# Patient Record
Sex: Female | Born: 1980 | Race: White | Hispanic: No | Marital: Single
Health system: Southern US, Community
[De-identification: ages and names within clinical notes are randomized; demographics above are authoritative.]

## PROBLEM LIST (undated history)

## (undated) DIAGNOSIS — K219 Gastro-esophageal reflux disease without esophagitis: Secondary | ICD-10-CM

## (undated) DIAGNOSIS — N200 Calculus of kidney: Secondary | ICD-10-CM

---

## 2014-12-22 HISTORY — PX: BACK SURGERY: SHX140

## 2016-11-13 ENCOUNTER — Emergency Department (HOSPITAL_BASED_OUTPATIENT_CLINIC_OR_DEPARTMENT_OTHER): Payer: Medicaid - Out of State

## 2016-11-13 ENCOUNTER — Emergency Department (HOSPITAL_BASED_OUTPATIENT_CLINIC_OR_DEPARTMENT_OTHER)
Admission: EM | Admit: 2016-11-13 | Discharge: 2016-11-14 | Disposition: A | Discharge status: Home / Self Care | Payer: Medicaid - Out of State | Attending: Emergency Medicine | Admitting: Emergency Medicine

## 2016-11-13 ENCOUNTER — Encounter (HOSPITAL_BASED_OUTPATIENT_CLINIC_OR_DEPARTMENT_OTHER): Payer: Self-pay

## 2016-11-13 DIAGNOSIS — R109 Unspecified abdominal pain: Secondary | ICD-10-CM

## 2016-11-13 DIAGNOSIS — Z79899 Other long term (current) drug therapy: Secondary | ICD-10-CM | POA: Diagnosis not present

## 2016-11-13 DIAGNOSIS — R1011 Right upper quadrant pain: Secondary | ICD-10-CM

## 2016-11-13 HISTORY — DX: Calculus of kidney: N20.0

## 2016-11-13 HISTORY — DX: Gastro-esophageal reflux disease without esophagitis: K21.9

## 2016-11-13 LAB — COMPREHENSIVE METABOLIC PANEL
ALBUMIN: 3.8 g/dL (ref 3.5–5.0)
ALT: 19 U/L (ref 14–54)
AST: 25 U/L (ref 15–41)
Alkaline Phosphatase: 123 U/L (ref 38–126)
Anion gap: 9 (ref 5–15)
BUN: 11 mg/dL (ref 6–20)
CHLORIDE: 104 mmol/L (ref 101–111)
CO2: 24 mmol/L (ref 22–32)
CREATININE: 0.69 mg/dL (ref 0.44–1.00)
Calcium: 8.7 mg/dL — ABNORMAL LOW (ref 8.9–10.3)
GFR calc Af Amer: >60 mL/min (ref >60)
GFR calc non Af Amer: >60 mL/min (ref >60)
GLUCOSE: 99 mg/dL (ref 65–99)
POTASSIUM: 3.9 mmol/L (ref 3.5–5.1)
SODIUM: 137 mmol/L (ref 135–145)
Total Bilirubin: 0.3 mg/dL (ref 0.3–1.2)
Total Protein: 7.2 g/dL (ref 6.5–8.1)

## 2016-11-13 LAB — CBC WITH DIFFERENTIAL/PLATELET
BASOS ABS: 0.1 10*3/uL (ref 0.0–0.1)
BASOS PCT: 0 %
EOS PCT: 2 %
Eosinophils Absolute: 0.3 10*3/uL (ref 0.0–0.7)
HCT: 37.5 % (ref 36.0–46.0)
Hemoglobin: 12.6 g/dL (ref 12.0–15.0)
LYMPHS PCT: 21 %
Lymphs Abs: 3.9 10*3/uL (ref 0.7–4.0)
MCH: 33.2 pg (ref 26.0–34.0)
MCHC: 33.6 g/dL (ref 30.0–36.0)
MCV: 98.9 fL (ref 78.0–100.0)
MONO ABS: 1.7 10*3/uL — AB (ref 0.1–1.0)
Monocytes Relative: 9 %
Neutro Abs: 12.4 10*3/uL — ABNORMAL HIGH (ref 1.7–7.7)
Neutrophils Relative %: 68 %
PLATELETS: 320 10*3/uL (ref 150–400)
RBC: 3.79 MIL/uL — AB (ref 3.87–5.11)
RDW: 14.1 % (ref 11.5–15.5)
WBC: 18.3 10*3/uL — AB (ref 4.0–10.5)

## 2016-11-13 LAB — LIPASE, BLOOD: LIPASE: 17 U/L (ref 11–51)

## 2016-11-13 LAB — URINALYSIS, ROUTINE W REFLEX MICROSCOPIC
BILIRUBIN URINE: NEGATIVE
Glucose, UA: NEGATIVE mg/dL
HGB URINE DIPSTICK: NEGATIVE
Ketones, ur: NEGATIVE mg/dL
Leukocytes, UA: NEGATIVE
NITRITE: NEGATIVE
PROTEIN: NEGATIVE mg/dL
SPECIFIC GRAVITY, URINE: 1.011 (ref 1.005–1.030)
pH: 5.5 (ref 5.0–8.0)

## 2016-11-13 LAB — PREGNANCY, URINE: PREG TEST UR: NEGATIVE

## 2016-11-13 MED ORDER — ONDANSETRON HCL 4 MG/2ML IJ SOLN
4.0000 mg | Freq: Once | INTRAMUSCULAR | Status: AC
  Administered 2016-11-13: 4 mg via INTRAVENOUS
  Filled 2016-11-13: qty 2

## 2016-11-13 MED ORDER — IOPAMIDOL (ISOVUE-300) INJECTION 61%
100.0000 mL | Freq: Once | INTRAVENOUS | Status: AC | PRN
  Administered 2016-11-14: 100 mL via INTRAVENOUS

## 2016-11-13 MED ORDER — HYDROMORPHONE HCL 1 MG/ML IJ SOLN
1.0000 mg | Freq: Once | INTRAMUSCULAR | Status: AC
  Administered 2016-11-13: 1 mg via INTRAVENOUS
  Filled 2016-11-13: qty 1

## 2016-11-13 MED ORDER — SODIUM CHLORIDE 0.9 % IV BOLUS (SEPSIS)
1000.0000 mL | Freq: Once | INTRAVENOUS | Status: AC
  Administered 2016-11-13: 1000 mL via INTRAVENOUS

## 2016-11-13 NOTE — ED Triage Notes (Signed)
Pt c/o sudden onset right flank pain that radiates to her lower abdomen with difficulty urinating and n/v

## 2016-11-13 NOTE — ED Notes (Signed)
Patient ambulated to the restroom with stand by assist from significant other. Patient limping and bending over to walk.

## 2016-11-13 NOTE — ED Provider Notes (Signed)
[facility] Note   CSN: [id number] Arrival date & time: 11/13/16  2134  By signing my name below, I, Sonum Patel, attest that this documentation has been prepared under the direction and in the presence of RaytheonJosh Georgann Bramble PA-C. Electronically Signed: Sonum Patel, Neurosurgeoncribe. 11/13/16. 10:47 PM.  History   Chief Complaint Chief Complaint  Patient presents with  . Flank Pain    The history is provided by the patient and a significant other. No language interpreter was used.     HPI Comments: Victoria Sherman is a 35 y.o. female with past medical history of kidney stones who presents to the Emergency Department complaining acute onset right flank pain with radiation to the right lower abdomen and right shoulder starting about 8PM. She had an episode of pain, similar but less severe in [place] about 9 days ago. She had a mild episode in the interim. She states movement and deep breathing worsens her pain. She drank a couple of glasses of wine but denies daily alcohol use. She denies vomiting, fever, vaginal bleeding, vaginal discharge. She reports a back surgery which required incisions to the abdomen. No other surgeries and she still has her gallbladder and appendix. ? h/o kidney stone, states she passed something one time.   Past Medical History:  Diagnosis Date  . GERD (gastroesophageal reflux disease)   . Kidney stones     There are no active problems to display for this patient.   Past Surgical History:  Procedure Laterality Date  . BACK SURGERY  2016   spinal fusion    OB History    No data available       Home Medications    Prior to Admission medications   Medication Sig Start Date End Date Taking? Authorizing Provider  omeprazole (PRILOSEC) 20 MG capsule Take 20 mg by mouth daily.   Yes Historical Provider, MD  pregabalin (LYRICA) 200 MG capsule Take 200 mg by mouth 3 (three) times daily.   Yes Historical Provider, MD    Family History No family  history on file.  Social History Social History  Substance Use Topics  . Smoking status: Not on file  . Smokeless tobacco: Not on file  . Alcohol use Not on file     Allergies   Patient has no known allergies.   Review of Systems Review of Systems  Constitutional: Negative for fever.  HENT: Negative.  Negative for rhinorrhea and sore throat.   Eyes: Negative for redness.  Respiratory: Negative.  Negative for cough.   Cardiovascular: Negative.  Negative for chest pain.  Gastrointestinal: Positive for abdominal pain. Negative for diarrhea, nausea and vomiting.  Endocrine: Negative.   Genitourinary: Positive for flank pain. Negative for dysuria, hematuria, vaginal bleeding and vaginal discharge.  Musculoskeletal: Negative for myalgias.  Skin: Negative.  Negative for rash.  Neurological: Negative.  Negative for headaches.     Physical Exam Updated Vital Signs BP (!) 153/106 (BP Location: Right Arm)   Pulse 99   Temp 98.6 F (37 C) (Oral)   Resp 18   Ht 5\' 5"  (1.651 m)   Wt 155 lb (70.3 kg)   LMP 10/28/2016   SpO2 100%   BMI 25.79 kg/m   Physical Exam  Constitutional: She is oriented to person, place, and time. She appears well-developed and well-nourished. She appears distressed (appears uncomfortable).  HENT:  Head: Normocephalic and atraumatic.  Eyes: Conjunctivae are normal. Right eye exhibits no discharge. Left eye exhibits no discharge.  Neck:  Normal range of motion. Neck supple.  Cardiovascular: Normal rate, regular rhythm and normal heart sounds.   Pulmonary/Chest: Effort normal and breath sounds normal. No respiratory distress. She has no wheezes. She has no rales.  Abdominal: Soft. She exhibits no distension and no mass. There is tenderness (R abdomen). There is no guarding.  Neurological: She is alert and oriented to person, place, and time.  Skin: Skin is warm and dry.  Psychiatric: She has a normal mood and affect.  Nursing note and vitals  reviewed.    ED Treatments / Results  DIAGNOSTIC STUDIES: Oxygen Saturation is 100% on RA, normal by my interpretation.    COORDINATION OF CARE: 10:47 PM Will order labs and CT. Discussed treatment plan with pt at bedside and pt agreed to plan.    Labs (all labs ordered are listed, but only abnormal results are displayed) Labs Reviewed  CBC WITH DIFFERENTIAL/PLATELET - Abnormal; Notable for the following:       Result Value   WBC 18.3 (*)    RBC 3.79 (*)    Neutro Abs 12.4 (*)    Monocytes Absolute 1.7 (*)    All other components within normal limits  COMPREHENSIVE METABOLIC PANEL - Abnormal; Notable for the following:    Calcium 8.7 (*)    All other components within normal limits  URINALYSIS, ROUTINE W REFLEX MICROSCOPIC (NOT AT [facility])  PREGNANCY, URINE  LIPASE, BLOOD   Radiology No results found.  Procedures Procedures (including critical care time)  Medications Ordered in ED Medications  HYDROmorphone (DILAUDID) injection 1 mg (1 mg Intravenous Given 11/13/16 2313)  ondansetron (ZOFRAN) injection 4 mg (4 mg Intravenous Given 11/13/16 2313)  sodium chloride 0.9 % bolus 1,000 mL (1,000 mLs Intravenous New Bag/Given 11/13/16 2259)  iopamidol (ISOVUE-300) 61 % injection 100 mL (100 mLs Intravenous Contrast Given 11/14/16 0037)     Initial Impression / Assessment and Plan / ED Course  I have reviewed the triage vital signs and the nursing notes.  Pertinent labs & imaging results that were available during my care of the patient were reviewed by me and considered in my medical decision making (see chart for details).  Clinical Course    CT imaging reviewed by myself. Awaiting read.   Vital signs reviewed and are as follows: BP 114/71 (BP Location: Right Arm)   Pulse 93   Temp 98.6 F (37 C) (Oral)   Resp 20   Ht 5\' 5"  (1.651 m)   Wt 70.3 kg   LMP 10/28/2016   SpO2 98%   BMI 25.79 kg/m   Handoff to Dr. Bebe ShaggyWickline at shift change.    Final Clinical  Impressions(s) / ED Diagnoses   Final diagnoses:  Right flank pain   Patient with R flank pain. Leukocytosis -- pending completion of work-up.   New Prescriptions New Prescriptions   No medications on file   I personally performed the services described in this documentation, which was scribed in my presence. The recorded information has been reviewed and is accurate.    Renne CriglerJoshua Rex Magee, PA-C 11/14/16 0111    Zadie Rhineonald Wickline, MD 11/14/16 [phone]

## 2016-11-14 ENCOUNTER — Emergency Department (HOSPITAL_BASED_OUTPATIENT_CLINIC_OR_DEPARTMENT_OTHER): Payer: Medicaid - Out of State

## 2016-11-14 ENCOUNTER — Ambulatory Visit (HOSPITAL_BASED_OUTPATIENT_CLINIC_OR_DEPARTMENT_OTHER)
Admit: 2016-11-14 | Discharge: 2016-11-14 | Disposition: A | Discharge status: Home / Self Care | Payer: Medicaid - Out of State | Attending: Emergency Medicine | Admitting: Emergency Medicine

## 2016-11-14 ENCOUNTER — Encounter (HOSPITAL_BASED_OUTPATIENT_CLINIC_OR_DEPARTMENT_OTHER): Payer: Self-pay

## 2016-11-14 DIAGNOSIS — R1011 Right upper quadrant pain: Secondary | ICD-10-CM

## 2016-11-14 MED ORDER — HYDROMORPHONE HCL 1 MG/ML IJ SOLN
1.0000 mg | Freq: Once | INTRAMUSCULAR | Status: AC
  Administered 2016-11-14: 1 mg via INTRAVENOUS
  Filled 2016-11-14: qty 1

## 2016-11-14 MED ORDER — KETOROLAC TROMETHAMINE 15 MG/ML IJ SOLN
15.0000 mg | Freq: Once | INTRAMUSCULAR | Status: AC
  Administered 2016-11-14: 15 mg via INTRAVENOUS
  Filled 2016-11-14: qty 1

## 2016-11-14 NOTE — ED Notes (Signed)
Patient transported to CT 

## 2016-11-14 NOTE — Discharge Instructions (Signed)
You will need to come back later today to have an ultrasound of your gallbladder  SEEK IMMEDIATE MEDICAL ATTENTION IF: The pain does not go away or becomes severe, particularly over the next 8-12 hours.  A temperature above 100.53F develops.  Repeated vomiting occurs (multiple episodes).  The pain becomes localized to portions of the abdomen. The right side could possibly be appendicitis. In an adult, the left lower portion of the abdomen could be colitis or diverticulitis.  Blood is being passed in stools or vomit (bright red or black tarry stools).  Return also if you develop chest pain, difficulty breathing, dizziness or fainting, or become confused, poorly responsive, or inconsolable.

## 2017-06-24 IMAGING — CT CT ABD-PELV W/ CM
2 of 4 series · 16 of 46 positions shown, 18 images · IV contrast (APPLIED)
Comparison: None.

CLINICAL DATA: Severe right abdominal pain for 4 hours. Similar
episode on 11/04/2016. White cell count 18.3.

EXAM:
CT ABDOMEN AND PELVIS WITH CONTRAST
TECHNIQUE: Multidetector CT imaging of the abdomen and pelvis was performed
using the standard protocol following bolus administration of
intravenous contrast.
CONTRAST:  100 mL Dsovue-2II

[Series 2: axial st · axial · 0.88mm/px · z∈[-526,-66]mm · 13 of 100 slices shown, 15 images]
[im 4/100  soft-tissue]
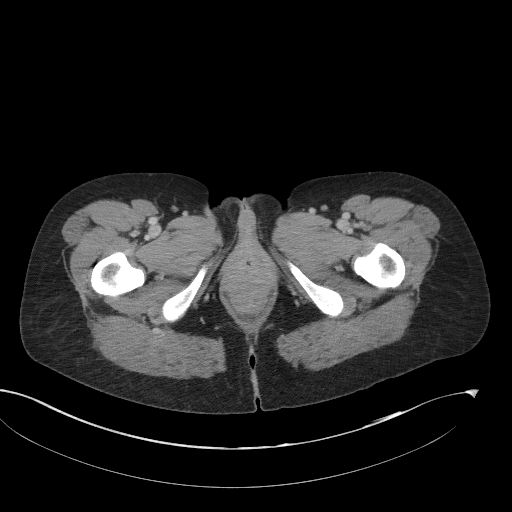
[im 4/100  bone]
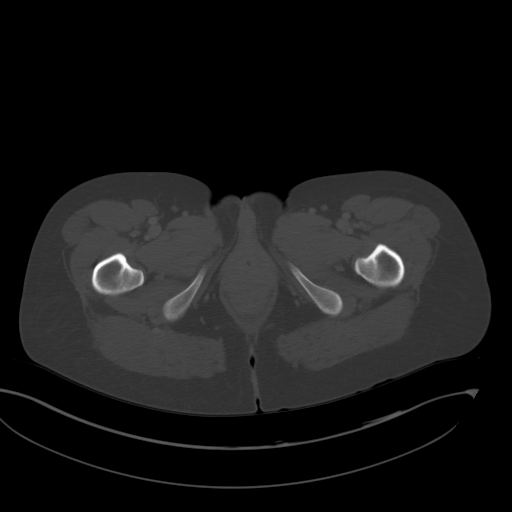
[im 12/100  soft-tissue]
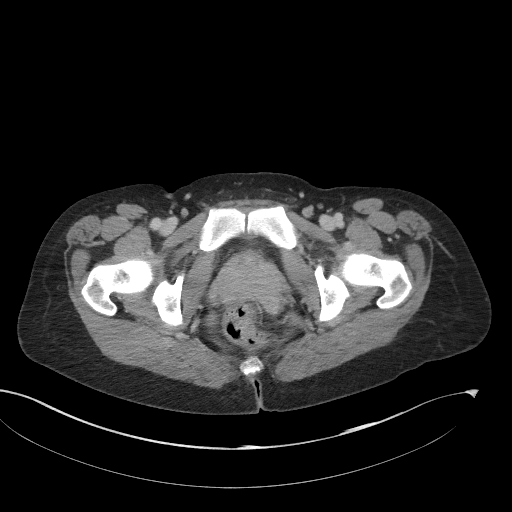
[im 20/100  soft-tissue]
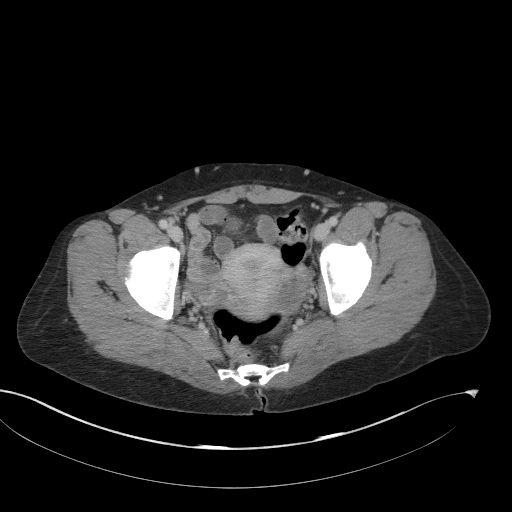
[im 28/100  soft-tissue]
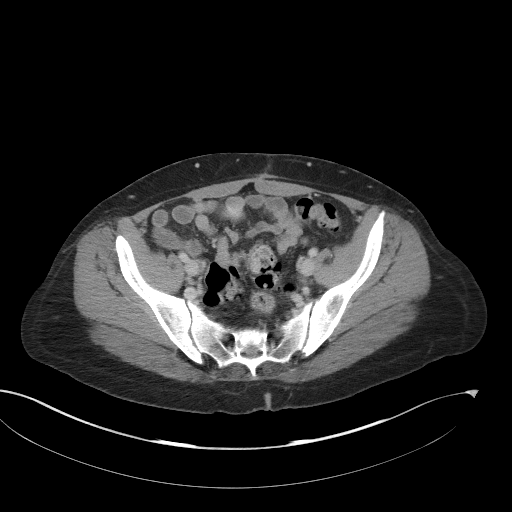
[im 36/100  soft-tissue]
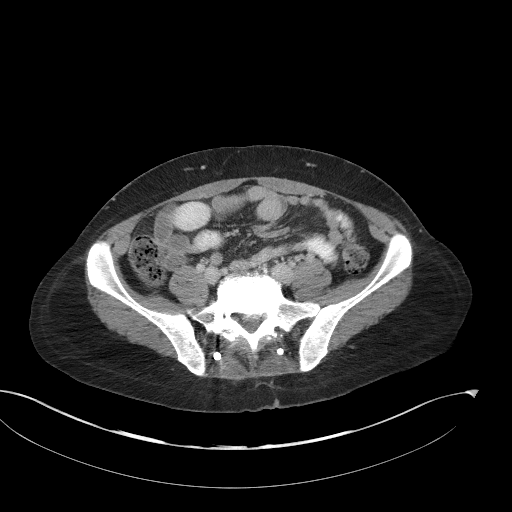
[im 44/100  soft-tissue]
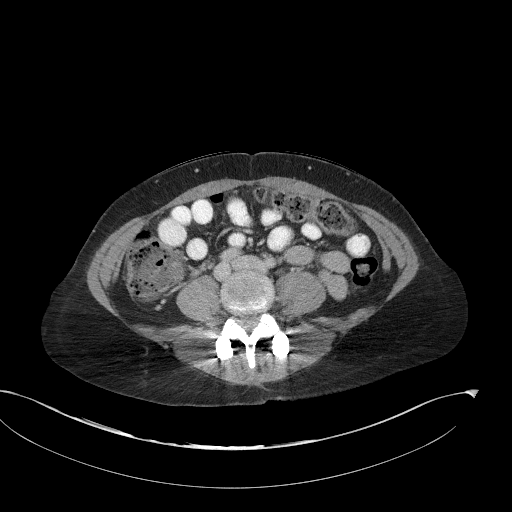
[im 52/100  soft-tissue]
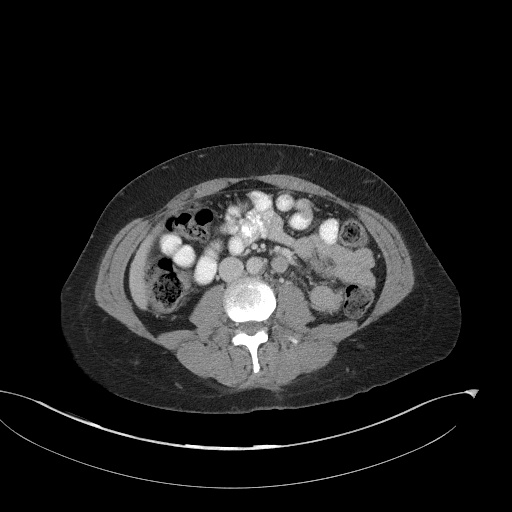
[im 56/100  soft-tissue]
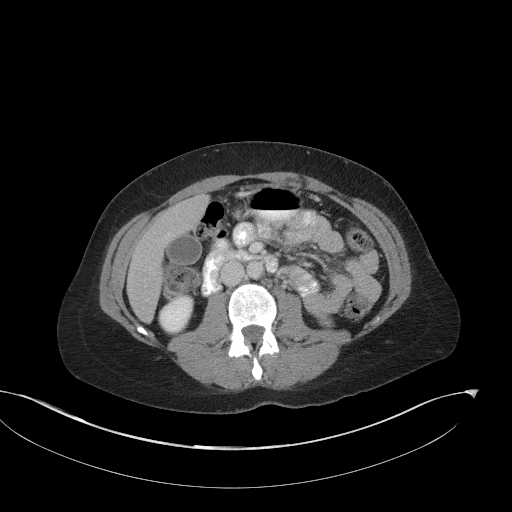
[im 64/100  soft-tissue]
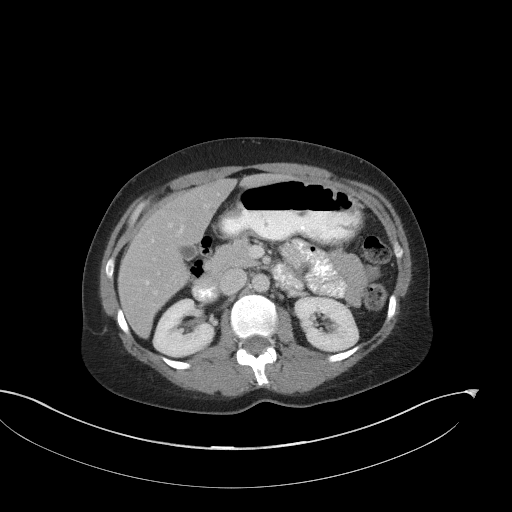
[im 64/100  bone]
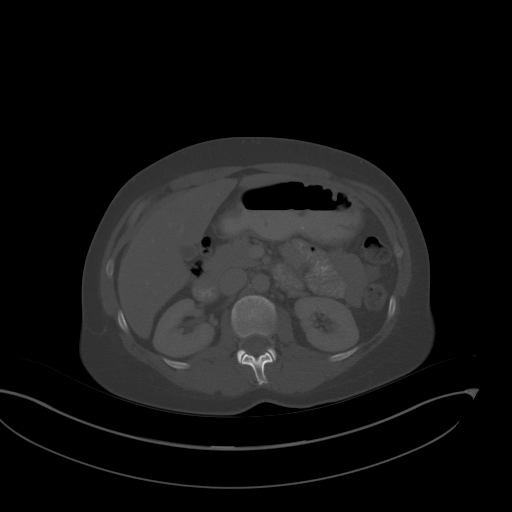
[im 72/100  soft-tissue]
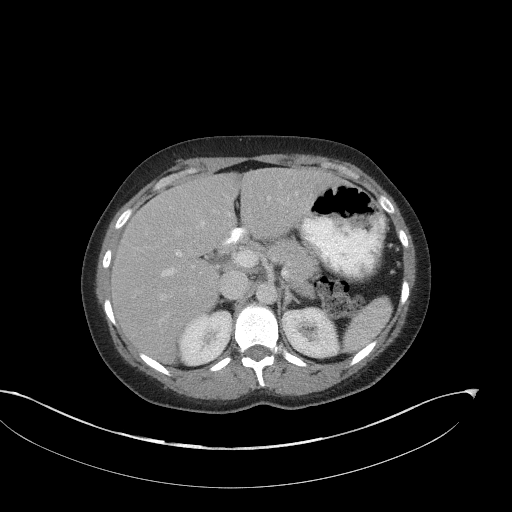
[im 80/100  soft-tissue]
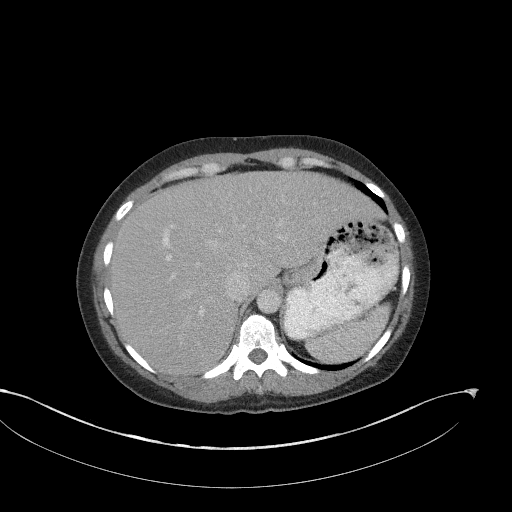
[im 88/100  soft-tissue]
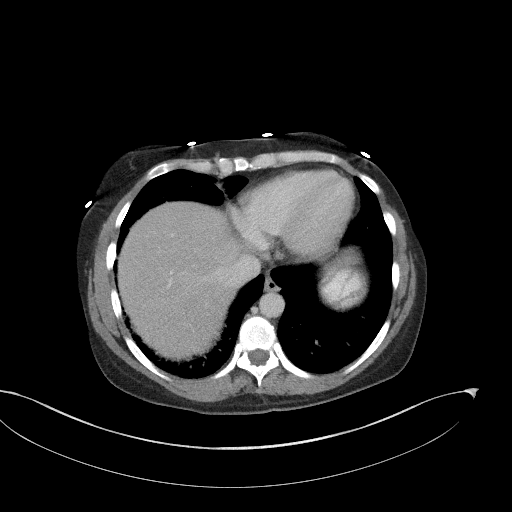
[im 96/100  soft-tissue]
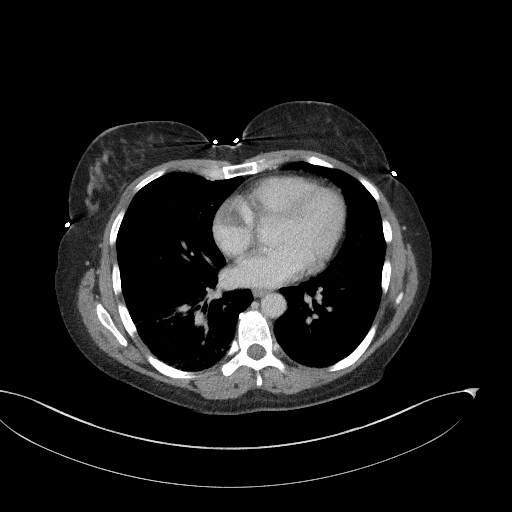

[Series 5: coronal st · coronal · 0.78mm/px · 3 of 76 slices shown]
[im 26/76  soft-tissue]
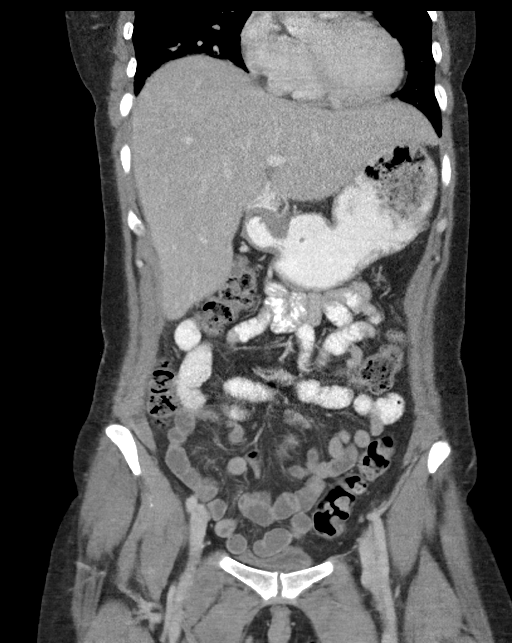
[im 34/76  soft-tissue]
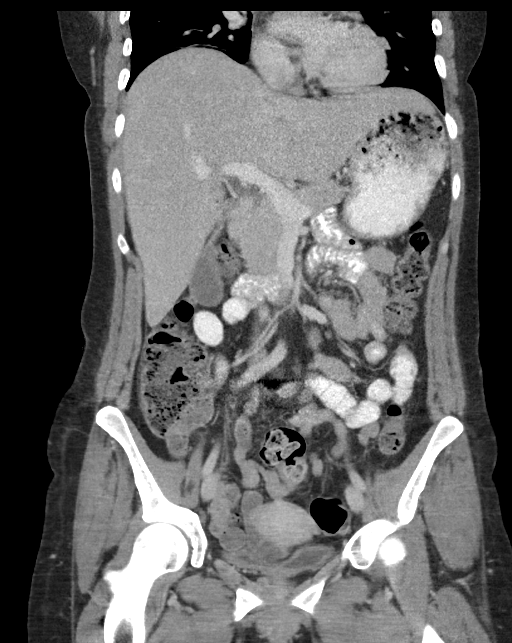
[im 42/76  soft-tissue]
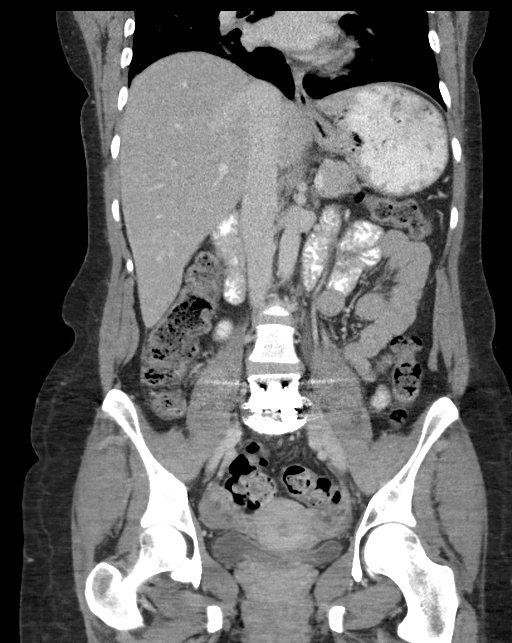

[16 of 46 positions shown; findings below may reference images not displayed]

FINDINGS: Lower chest: Atelectasis or infiltration in the lung bases, more
prominent on the right. Changes could indicate pneumonia in the
appropriate clinical setting.

Hepatobiliary: No focal liver abnormality is seen. No gallstones,
gallbladder wall thickening, or biliary dilatation.

Pancreas: Unremarkable. No pancreatic ductal dilatation or
surrounding inflammatory changes.

Spleen: Normal in size without focal abnormality.

Adrenals/Urinary Tract: Adrenal glands are unremarkable. Kidneys are
normal, without renal calculi, focal lesion, or hydronephrosis.
Bladder is unremarkable.

Stomach/Bowel: Stomach is within normal limits. Appendix appears
normal. No evidence of bowel wall thickening, distention, or
inflammatory changes.

Vascular/Lymphatic: Aortic atherosclerosis. No enlarged abdominal or
pelvic lymph nodes.

Reproductive: Uterus and bilateral adnexa are unremarkable. Small
cysts in the ovaries are likely functional.

Other: No abdominal wall hernia or abnormality. No abdominopelvic
ascites.

Musculoskeletal: Postoperative changes in the lumbar spine with
posterior fixation intervertebral fusion at L5-S1.
IMPRESSION: No acute process demonstrated in the abdomen or pelvis. No evidence
of bowel obstruction or inflammation. Infiltration or atelectasis in
the lung bases, more prominent on the right. Changes could indicate
pneumonia in the appropriate clinical setting.

## 2017-06-24 IMAGING — DX DG CHEST 1V PORT
1 series · 1 of 1 positions shown · non-contrast
Comparison: None.

CLINICAL DATA: Acute onset of chronic cough.  Initial encounter.

EXAM:
PORTABLE CHEST 1 VIEW

[chest ap]
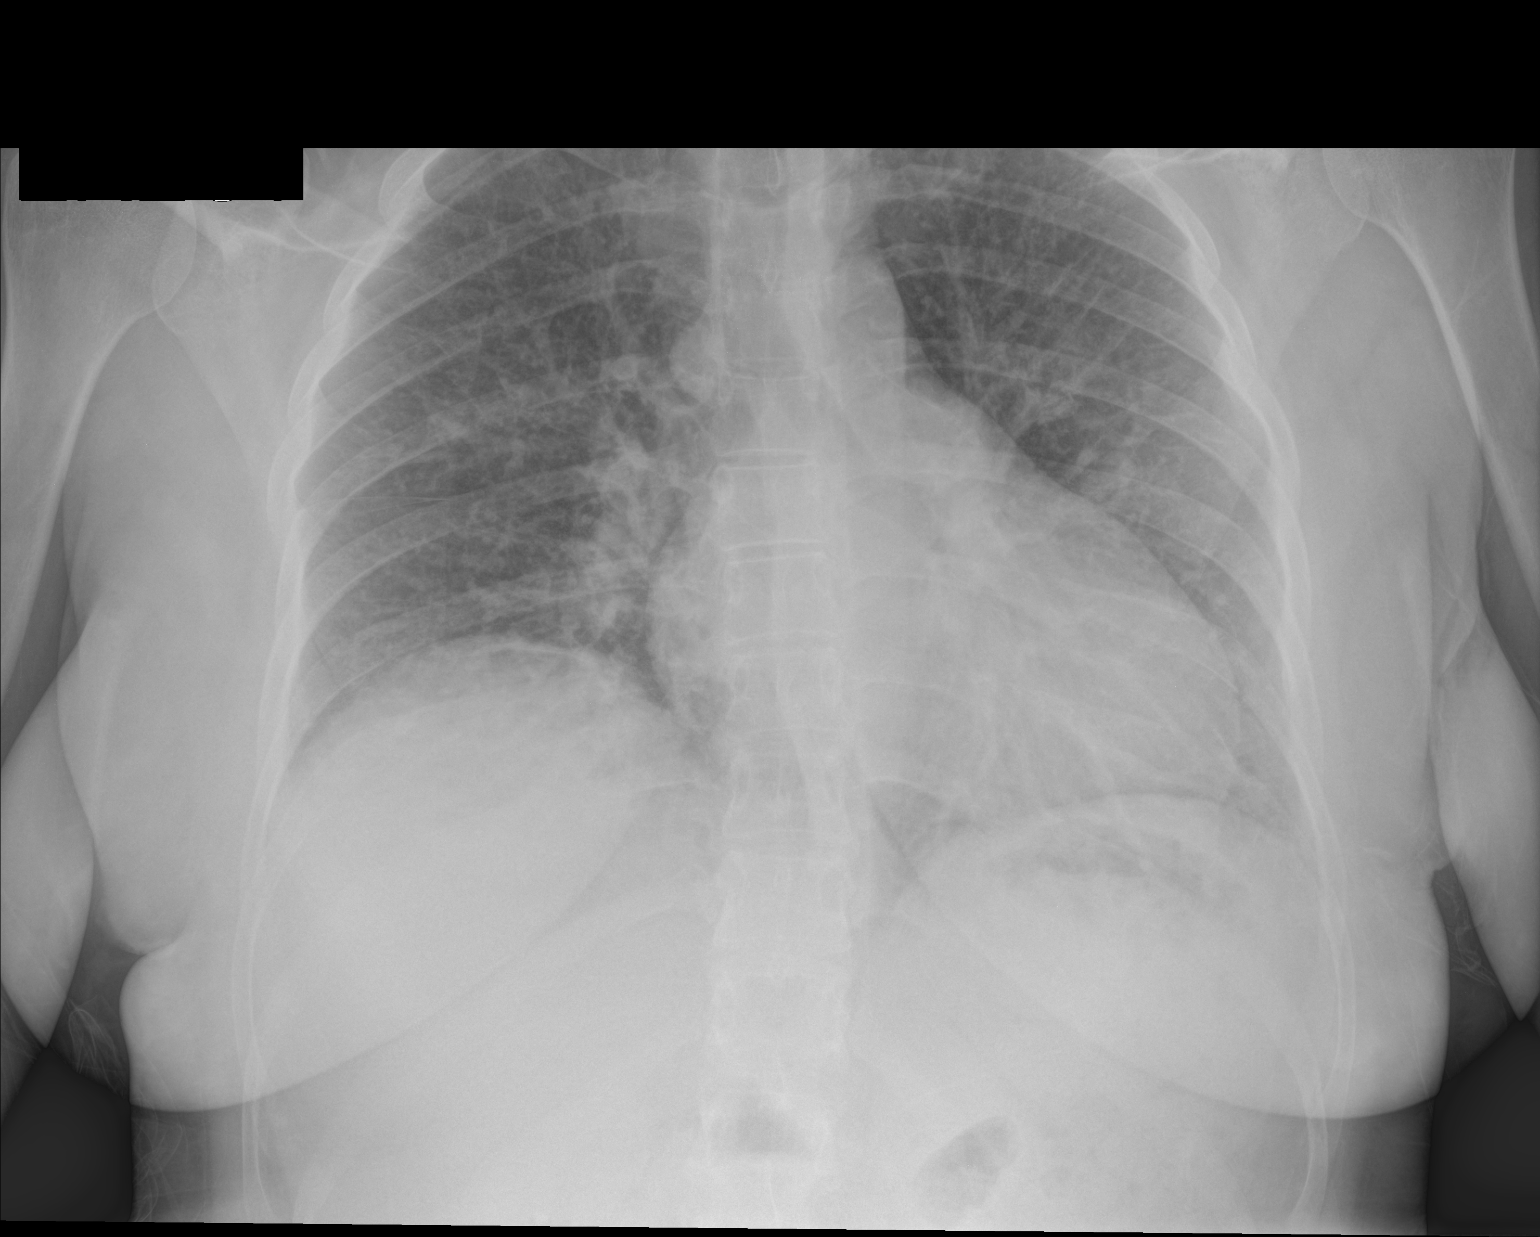

[1 of 1 positions shown; findings below may reference images not displayed]

FINDINGS: The lungs are well-aerated. Vascular congestion is noted. Increased
interstitial markings could reflect mild interstitial edema or
chronic interstitial lung changes. There is no evidence of pleural
effusion or pneumothorax.

The cardiomediastinal silhouette is borderline normal in size. No
acute osseous abnormalities are seen.
IMPRESSION: Vascular congestion noted. Increased interstitial markings could
reflect mild interstitial edema or chronic interstitial lung
changes.
